# Patient Record
Sex: Male | Born: 1985 | Race: White | Hispanic: No | Marital: Married | Smoking: Never smoker
Health system: Southern US, Community
[De-identification: ages and names within clinical notes are randomized; demographics above are authoritative.]

## PROBLEM LIST (undated history)

## (undated) HISTORY — PX: VASECTOMY: SHX75

---
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
## 2021-08-02 ENCOUNTER — Encounter (HOSPITAL_BASED_OUTPATIENT_CLINIC_OR_DEPARTMENT_OTHER): Payer: Self-pay

## 2021-08-02 ENCOUNTER — Other Ambulatory Visit: Payer: Self-pay

## 2021-08-02 ENCOUNTER — Emergency Department (HOSPITAL_BASED_OUTPATIENT_CLINIC_OR_DEPARTMENT_OTHER)
Admission: EM | Admit: 2021-08-02 | Discharge: 2021-08-02 | Disposition: A | Discharge status: Home / Self Care | Payer: BC Managed Care – PPO | Attending: Emergency Medicine | Admitting: Emergency Medicine

## 2021-08-02 DIAGNOSIS — T18128A Food in esophagus causing other injury, initial encounter: Secondary | ICD-10-CM | POA: Diagnosis not present

## 2021-08-02 DIAGNOSIS — X58XXXA Exposure to other specified factors, initial encounter: Secondary | ICD-10-CM | POA: Insufficient documentation

## 2021-08-02 DIAGNOSIS — R1110 Vomiting, unspecified: Secondary | ICD-10-CM | POA: Insufficient documentation

## 2021-08-02 DIAGNOSIS — Z20822 Contact with and (suspected) exposure to covid-19: Secondary | ICD-10-CM | POA: Insufficient documentation

## 2021-08-02 LAB — RESP PANEL BY RT-PCR (FLU A&B, COVID) ARPGX2
Influenza A by PCR: NEGATIVE
Influenza B by PCR: NEGATIVE
SARS Coronavirus 2 by RT PCR: NEGATIVE

## 2021-08-02 NOTE — ED Triage Notes (Signed)
Patient here POV from Home with Foreign Body in Throat.  Patient states he was eating a Steak approximately at 1800 and since he's been having difficulty passing a piece of Steak.  Ambulatory, GCS 15. Patient in Discomfort during Triage.

## 2021-08-02 NOTE — ED Triage Notes (Signed)
Patient states he has a History of Same and is usually able to Vomit and gain Relief. Patient vomited several times today with no relief.

## 2021-08-02 NOTE — Discharge Instructions (Addendum)
Please eat liquids only as Discussed Call Dr. Haywood Pao office in the morning for appointment on Monday Return to [facility] emergency department if you are unable to tolerate fluids.

## 2021-08-03 NOTE — ED Provider Notes (Signed)
[facility] EMERGENCY DEPT Provider Note   CSN: [id number] Arrival date & time: 08/02/21  2126     History Chief Complaint  Patient presents with   Foreign Body In Throat    Alejandro Rhodes is a 35 y.o. male.  HPI  35 year old male presents tonight with sensation of foreign body in esophagus.  He states he has had multiple episodes in the past where he has felt that he has had a food impaction.  Tonight it occurred while eating steak.  He felt nothing else would pass.  He had 3 episodes of vomiting with the last occurring after arrival in the ED.  He states that he is now able to tolerate his saliva.  This has occurred 5 times in the past but he was always able to vomit and clear it.  He was never actually seen by a gastroenterologist.  He has recently moved to this area.  He denies any other medical problems or history.  He has had some pain with this but is not having any current pain.  History reviewed. No pertinent past medical history.  There are no problems to display for this patient.   History reviewed. No pertinent surgical history.     No family history on file.  Social History   Tobacco Use   Smoking status: Never   Smokeless tobacco: Never  Substance Use Topics   Alcohol use: Never   Drug use: Never    Home Medications Prior to Admission medications   Not on File    Allergies    Patient has no allergy information on record.  Review of Systems   Review of Systems  All other systems reviewed and are negative.  Physical Exam Updated Vital Signs BP 121/79 (BP Location: Right Arm)   Pulse 71   Temp 98.1 F (36.7 C) (Oral)   Resp 20   Ht 1.854 m (6\' 1" )   Wt 95.3 kg   SpO2 100%   BMI 27.71 kg/m   Physical Exam Vitals and nursing note reviewed.  Constitutional:      Appearance: He is well-developed.  HENT:     Head: Normocephalic and atraumatic.     Right Ear: External ear normal.     Left Ear: External ear normal.     Nose:  Nose normal.  Eyes:     Extraocular Movements: Extraocular movements intact.  Neck:     Trachea: No tracheal deviation.  Cardiovascular:     Rate and Rhythm: Normal rate.  Pulmonary:     Effort: Pulmonary effort is normal.  Abdominal:     General: Abdomen is flat. Bowel sounds are normal.     Palpations: Abdomen is soft.  Musculoskeletal:        General: Normal range of motion.     Cervical back: Normal range of motion.  Skin:    General: Skin is warm and dry.     Capillary Refill: Capillary refill takes less than 2 seconds.  Neurological:     Mental Status: He is alert and oriented to person, place, and time.  Psychiatric:        Mood and Affect: Mood normal.        Behavior: Behavior normal.    ED Results / Procedures / Treatments   Labs (all labs ordered are listed, but only abnormal results are displayed) Labs Reviewed  RESP PANEL BY RT-PCR (FLU A&B, COVID) [id number]    EKG None  Radiology No results found.  Procedures Procedures  Medications Ordered in ED Medications - No data to display  ED Course  I have reviewed the triage vital signs and the nursing notes.  Pertinent labs & imaging results that were available during my care of the patient were reviewed by me and considered in my medical decision making (see chart for details).    MDM Rules/Calculators/A&P                           Patient care discussed with Dr. Elnoria Howard.  In discussion with patient and his wife, decision made that patient will call office tomorrow for follow-up on Monday.  In the interim, he will take in only thickened liquids.  We have discussed return precautions and need for follow-up and he voices understanding. Final Clinical Impression(s) / ED Diagnoses Final diagnoses:  Food impaction of esophagus, initial encounter    Rx / DC Orders ED Discharge Orders     None        Margarita Grizzle, MD 08/03/21 1459

## 2021-08-06 DIAGNOSIS — R1110 Vomiting, unspecified: Secondary | ICD-10-CM | POA: Diagnosis not present

## 2021-08-06 DIAGNOSIS — R1310 Dysphagia, unspecified: Secondary | ICD-10-CM | POA: Diagnosis not present

## 2021-08-06 DIAGNOSIS — T18128A Food in esophagus causing other injury, initial encounter: Secondary | ICD-10-CM | POA: Diagnosis not present

## 2021-08-07 DIAGNOSIS — K200 Eosinophilic esophagitis: Secondary | ICD-10-CM | POA: Diagnosis not present

## 2021-08-07 DIAGNOSIS — K222 Esophageal obstruction: Secondary | ICD-10-CM | POA: Diagnosis not present

## 2021-08-07 DIAGNOSIS — R1310 Dysphagia, unspecified: Secondary | ICD-10-CM | POA: Diagnosis not present

## 2021-08-07 DIAGNOSIS — K2100 Gastro-esophageal reflux disease with esophagitis, without bleeding: Secondary | ICD-10-CM | POA: Diagnosis not present

## 2021-09-12 DIAGNOSIS — K200 Eosinophilic esophagitis: Secondary | ICD-10-CM | POA: Diagnosis not present

## 2021-10-02 DIAGNOSIS — Z800 Family history of malignant neoplasm of digestive organs: Secondary | ICD-10-CM | POA: Diagnosis not present

## 2021-10-02 DIAGNOSIS — E559 Vitamin D deficiency, unspecified: Secondary | ICD-10-CM | POA: Diagnosis not present

## 2021-10-02 DIAGNOSIS — Z0001 Encounter for general adult medical examination with abnormal findings: Secondary | ICD-10-CM | POA: Diagnosis not present

## 2021-10-02 DIAGNOSIS — Z13220 Encounter for screening for lipoid disorders: Secondary | ICD-10-CM | POA: Diagnosis not present

## 2021-10-02 DIAGNOSIS — Z0000 Encounter for general adult medical examination without abnormal findings: Secondary | ICD-10-CM | POA: Diagnosis not present

## 2021-10-02 DIAGNOSIS — B079 Viral wart, unspecified: Secondary | ICD-10-CM | POA: Diagnosis not present

## 2021-11-01 DIAGNOSIS — H1013 Acute atopic conjunctivitis, bilateral: Secondary | ICD-10-CM | POA: Diagnosis not present

## 2021-11-01 DIAGNOSIS — J3089 Other allergic rhinitis: Secondary | ICD-10-CM | POA: Diagnosis not present

## 2021-11-01 DIAGNOSIS — K9049 Malabsorption due to intolerance, not elsewhere classified: Secondary | ICD-10-CM | POA: Diagnosis not present

## 2021-11-01 DIAGNOSIS — Z23 Encounter for immunization: Secondary | ICD-10-CM | POA: Diagnosis not present

## 2021-11-01 DIAGNOSIS — K200 Eosinophilic esophagitis: Secondary | ICD-10-CM | POA: Diagnosis not present

## 2021-11-12 DIAGNOSIS — Z3009 Encounter for other general counseling and advice on contraception: Secondary | ICD-10-CM | POA: Diagnosis not present

## 2022-02-20 DIAGNOSIS — Z302 Encounter for sterilization: Secondary | ICD-10-CM | POA: Diagnosis not present

## 2022-05-24 ENCOUNTER — Encounter (HOSPITAL_BASED_OUTPATIENT_CLINIC_OR_DEPARTMENT_OTHER): Payer: Self-pay

## 2022-05-24 ENCOUNTER — Emergency Department (HOSPITAL_BASED_OUTPATIENT_CLINIC_OR_DEPARTMENT_OTHER): Payer: BC Managed Care – PPO | Admitting: Radiology

## 2022-05-24 ENCOUNTER — Other Ambulatory Visit: Payer: Self-pay

## 2022-05-24 ENCOUNTER — Other Ambulatory Visit (HOSPITAL_BASED_OUTPATIENT_CLINIC_OR_DEPARTMENT_OTHER): Payer: Self-pay

## 2022-05-24 ENCOUNTER — Emergency Department (HOSPITAL_BASED_OUTPATIENT_CLINIC_OR_DEPARTMENT_OTHER)
Admission: EM | Admit: 2022-05-24 | Discharge: 2022-05-24 | Disposition: A | Discharge status: Home / Self Care | Payer: BC Managed Care – PPO | Attending: Emergency Medicine | Admitting: Emergency Medicine

## 2022-05-24 DIAGNOSIS — M25551 Pain in right hip: Secondary | ICD-10-CM | POA: Diagnosis not present

## 2022-05-24 DIAGNOSIS — S4991XA Unspecified injury of right shoulder and upper arm, initial encounter: Secondary | ICD-10-CM | POA: Diagnosis not present

## 2022-05-24 DIAGNOSIS — S40211A Abrasion of right shoulder, initial encounter: Secondary | ICD-10-CM | POA: Insufficient documentation

## 2022-05-24 DIAGNOSIS — Y9355 Activity, bike riding: Secondary | ICD-10-CM | POA: Diagnosis not present

## 2022-05-24 DIAGNOSIS — W052XXA Fall from non-moving motorized mobility scooter, initial encounter: Secondary | ICD-10-CM | POA: Diagnosis not present

## 2022-05-24 DIAGNOSIS — Z043 Encounter for examination and observation following other accident: Secondary | ICD-10-CM | POA: Diagnosis not present

## 2022-05-24 DIAGNOSIS — S4351XA Sprain of right acromioclavicular joint, initial encounter: Secondary | ICD-10-CM | POA: Diagnosis not present

## 2022-05-24 MED ORDER — OXYCODONE-ACETAMINOPHEN 5-325 MG PO TABS
1.0000 | ORAL_TABLET | Freq: Four times a day (QID) | ORAL | 0 refills | Status: DC | PRN
  Filled 2022-05-24: qty 12, 3d supply, fill #0

## 2022-05-24 MED ORDER — OXYCODONE-ACETAMINOPHEN 5-325 MG PO TABS
1.0000 | ORAL_TABLET | Freq: Once | ORAL | Status: AC
  Administered 2022-05-24: 1 via ORAL
  Filled 2022-05-24: qty 1

## 2022-05-24 MED ORDER — IBUPROFEN 800 MG PO TABS
800.0000 mg | ORAL_TABLET | Freq: Once | ORAL | Status: AC
  Administered 2022-05-24: 800 mg via ORAL
  Filled 2022-05-24: qty 1

## 2022-05-24 NOTE — ED Notes (Signed)
Pt d/c home with mom per MD order. Discharge summary reviewed , verbalize understanding. Off unit via WC. No s/s of acute distress noted. Reports mother is discharge ride home.

## 2022-05-24 NOTE — ED Provider Notes (Signed)
My attending, assigned to this chart, was unable to access his [employer] software for prescribing Schedule II prescriptions. I checked his PDMP and prescribed Percoc[facility] qty:[facility].    Achille Rich, PA-C 05/24/22 1256    Milagros Loll, MD 05/25/22 [phone]
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
## 2022-05-24 NOTE — Discharge Instructions (Addendum)
Please follow-up with orthopedic surgeon regarding your [facility] joint injury. The radiologist said it is: "At least grade 3 right [facility] joint separation without acute fracture."  I would recommend using the sling and no weightbearing for now unless you are further instructed by Ortho.  If at any point you develop generalized abdominal pain, vomiting, fever, blood in urine, difficulty breathing, confusion, episodes of passing out or other new concerning symptom, come back to ER for reassessment.

## 2022-05-24 NOTE — ED Notes (Signed)
Patient transported to X-ray 

## 2022-05-28 ENCOUNTER — Other Ambulatory Visit (HOSPITAL_COMMUNITY): Payer: Self-pay

## 2022-05-28 DIAGNOSIS — M25511 Pain in right shoulder: Secondary | ICD-10-CM | POA: Diagnosis not present

## 2022-05-28 MED ORDER — GABAPENTIN 100 MG PO CAPS
100.0000 mg | ORAL_CAPSULE | Freq: Three times a day (TID) | ORAL | 1 refills | Status: DC
  Filled 2022-05-28: qty 90, 30d supply, fill #0

## 2022-05-28 MED ORDER — ACETAMINOPHEN EXTRA STRENGTH 500 MG PO CAPS
1000.0000 mg | ORAL_CAPSULE | Freq: Three times a day (TID) | ORAL | 1 refills | Status: DC
Start: 2022-05-28 — End: 2023-07-15

## 2022-05-28 MED ORDER — MELOXICAM 7.5 MG PO TABS
7.5000 mg | ORAL_TABLET | Freq: Two times a day (BID) | ORAL | 2 refills | Status: DC
  Filled 2022-05-28: qty 60, 30d supply, fill #0

## 2022-06-03 ENCOUNTER — Other Ambulatory Visit (HOSPITAL_COMMUNITY): Payer: Self-pay

## 2022-06-03 DIAGNOSIS — S43101A Unspecified dislocation of right acromioclavicular joint, initial encounter: Secondary | ICD-10-CM | POA: Diagnosis not present

## 2022-06-03 DIAGNOSIS — M24111 Other articular cartilage disorders, right shoulder: Secondary | ICD-10-CM | POA: Diagnosis not present

## 2022-06-03 DIAGNOSIS — Y999 Unspecified external cause status: Secondary | ICD-10-CM | POA: Diagnosis not present

## 2022-06-03 DIAGNOSIS — S43131A Dislocation of right acromioclavicular joint, greater than 200% displacement, initial encounter: Secondary | ICD-10-CM | POA: Diagnosis not present

## 2022-06-03 DIAGNOSIS — X58XXXA Exposure to other specified factors, initial encounter: Secondary | ICD-10-CM | POA: Diagnosis not present

## 2022-06-03 MED ORDER — OXYCODONE HCL 5 MG PO TABS
5.0000 mg | ORAL_TABLET | Freq: Four times a day (QID) | ORAL | 0 refills | Status: DC
  Filled 2022-06-03: qty 30, 5d supply, fill #0

## 2022-06-03 MED ORDER — ONDANSETRON HCL 4 MG PO TABS
4.0000 mg | ORAL_TABLET | Freq: Four times a day (QID) | ORAL | 0 refills | Status: DC | PRN
  Filled 2022-06-03: qty 10, 7d supply, fill #0

## 2022-06-11 DIAGNOSIS — M24111 Other articular cartilage disorders, right shoulder: Secondary | ICD-10-CM | POA: Diagnosis not present

## 2022-06-26 DIAGNOSIS — M25611 Stiffness of right shoulder, not elsewhere classified: Secondary | ICD-10-CM | POA: Diagnosis not present

## 2022-06-26 DIAGNOSIS — S4351XD Sprain of right acromioclavicular joint, subsequent encounter: Secondary | ICD-10-CM | POA: Diagnosis not present

## 2022-06-26 DIAGNOSIS — M6281 Muscle weakness (generalized): Secondary | ICD-10-CM | POA: Diagnosis not present

## 2022-07-03 DIAGNOSIS — M25611 Stiffness of right shoulder, not elsewhere classified: Secondary | ICD-10-CM | POA: Diagnosis not present

## 2022-07-03 DIAGNOSIS — M6281 Muscle weakness (generalized): Secondary | ICD-10-CM | POA: Diagnosis not present

## 2022-07-10 DIAGNOSIS — M25611 Stiffness of right shoulder, not elsewhere classified: Secondary | ICD-10-CM | POA: Diagnosis not present

## 2022-07-10 DIAGNOSIS — M6281 Muscle weakness (generalized): Secondary | ICD-10-CM | POA: Diagnosis not present

## 2022-07-17 DIAGNOSIS — M25611 Stiffness of right shoulder, not elsewhere classified: Secondary | ICD-10-CM | POA: Diagnosis not present

## 2022-07-17 DIAGNOSIS — M6281 Muscle weakness (generalized): Secondary | ICD-10-CM | POA: Diagnosis not present

## 2022-07-24 DIAGNOSIS — M6281 Muscle weakness (generalized): Secondary | ICD-10-CM | POA: Diagnosis not present

## 2022-07-24 DIAGNOSIS — M25611 Stiffness of right shoulder, not elsewhere classified: Secondary | ICD-10-CM | POA: Diagnosis not present

## 2022-07-31 DIAGNOSIS — M25611 Stiffness of right shoulder, not elsewhere classified: Secondary | ICD-10-CM | POA: Diagnosis not present

## 2022-07-31 DIAGNOSIS — M6281 Muscle weakness (generalized): Secondary | ICD-10-CM | POA: Diagnosis not present

## 2022-08-07 DIAGNOSIS — M25611 Stiffness of right shoulder, not elsewhere classified: Secondary | ICD-10-CM | POA: Diagnosis not present

## 2022-08-07 DIAGNOSIS — M6281 Muscle weakness (generalized): Secondary | ICD-10-CM | POA: Diagnosis not present

## 2022-08-14 DIAGNOSIS — M25611 Stiffness of right shoulder, not elsewhere classified: Secondary | ICD-10-CM | POA: Diagnosis not present

## 2022-08-14 DIAGNOSIS — M6281 Muscle weakness (generalized): Secondary | ICD-10-CM | POA: Diagnosis not present

## 2022-08-20 DIAGNOSIS — M6281 Muscle weakness (generalized): Secondary | ICD-10-CM | POA: Diagnosis not present

## 2022-08-20 DIAGNOSIS — M25611 Stiffness of right shoulder, not elsewhere classified: Secondary | ICD-10-CM | POA: Diagnosis not present

## 2022-08-28 DIAGNOSIS — M25611 Stiffness of right shoulder, not elsewhere classified: Secondary | ICD-10-CM | POA: Diagnosis not present

## 2022-08-28 DIAGNOSIS — M6281 Muscle weakness (generalized): Secondary | ICD-10-CM | POA: Diagnosis not present

## 2022-08-29 DIAGNOSIS — S4351XD Sprain of right acromioclavicular joint, subsequent encounter: Secondary | ICD-10-CM | POA: Diagnosis not present

## 2022-09-05 DIAGNOSIS — M25611 Stiffness of right shoulder, not elsewhere classified: Secondary | ICD-10-CM | POA: Diagnosis not present

## 2022-09-05 DIAGNOSIS — M6281 Muscle weakness (generalized): Secondary | ICD-10-CM | POA: Diagnosis not present

## 2022-09-25 DIAGNOSIS — M6281 Muscle weakness (generalized): Secondary | ICD-10-CM | POA: Diagnosis not present

## 2022-09-25 DIAGNOSIS — M25611 Stiffness of right shoulder, not elsewhere classified: Secondary | ICD-10-CM | POA: Diagnosis not present

## 2022-10-07 DIAGNOSIS — Z0000 Encounter for general adult medical examination without abnormal findings: Secondary | ICD-10-CM | POA: Diagnosis not present

## 2022-10-08 DIAGNOSIS — M25611 Stiffness of right shoulder, not elsewhere classified: Secondary | ICD-10-CM | POA: Diagnosis not present

## 2022-10-08 DIAGNOSIS — M6281 Muscle weakness (generalized): Secondary | ICD-10-CM | POA: Diagnosis not present

## 2022-11-26 ENCOUNTER — Other Ambulatory Visit (HOSPITAL_COMMUNITY): Payer: Self-pay

## 2023-05-13 DIAGNOSIS — M7581 Other shoulder lesions, right shoulder: Secondary | ICD-10-CM | POA: Diagnosis not present

## 2023-05-19 DIAGNOSIS — S46111D Strain of muscle, fascia and tendon of long head of biceps, right arm, subsequent encounter: Secondary | ICD-10-CM | POA: Diagnosis not present

## 2023-05-19 DIAGNOSIS — S43431D Superior glenoid labrum lesion of right shoulder, subsequent encounter: Secondary | ICD-10-CM | POA: Diagnosis not present

## 2023-05-19 DIAGNOSIS — M6281 Muscle weakness (generalized): Secondary | ICD-10-CM | POA: Diagnosis not present

## 2023-05-19 DIAGNOSIS — S46011D Strain of muscle(s) and tendon(s) of the rotator cuff of right shoulder, subsequent encounter: Secondary | ICD-10-CM | POA: Diagnosis not present

## 2023-05-27 DIAGNOSIS — S46011D Strain of muscle(s) and tendon(s) of the rotator cuff of right shoulder, subsequent encounter: Secondary | ICD-10-CM | POA: Diagnosis not present

## 2023-05-27 DIAGNOSIS — S46111D Strain of muscle, fascia and tendon of long head of biceps, right arm, subsequent encounter: Secondary | ICD-10-CM | POA: Diagnosis not present

## 2023-05-27 DIAGNOSIS — M6281 Muscle weakness (generalized): Secondary | ICD-10-CM | POA: Diagnosis not present

## 2023-05-27 DIAGNOSIS — S43431D Superior glenoid labrum lesion of right shoulder, subsequent encounter: Secondary | ICD-10-CM | POA: Diagnosis not present

## 2023-06-03 DIAGNOSIS — M6281 Muscle weakness (generalized): Secondary | ICD-10-CM | POA: Diagnosis not present

## 2023-06-03 DIAGNOSIS — S46111D Strain of muscle, fascia and tendon of long head of biceps, right arm, subsequent encounter: Secondary | ICD-10-CM | POA: Diagnosis not present

## 2023-06-03 DIAGNOSIS — S46011D Strain of muscle(s) and tendon(s) of the rotator cuff of right shoulder, subsequent encounter: Secondary | ICD-10-CM | POA: Diagnosis not present

## 2023-06-03 DIAGNOSIS — S43431D Superior glenoid labrum lesion of right shoulder, subsequent encounter: Secondary | ICD-10-CM | POA: Diagnosis not present

## 2023-06-10 DIAGNOSIS — S46011D Strain of muscle(s) and tendon(s) of the rotator cuff of right shoulder, subsequent encounter: Secondary | ICD-10-CM | POA: Diagnosis not present

## 2023-06-10 DIAGNOSIS — S46111D Strain of muscle, fascia and tendon of long head of biceps, right arm, subsequent encounter: Secondary | ICD-10-CM | POA: Diagnosis not present

## 2023-06-10 DIAGNOSIS — M6281 Muscle weakness (generalized): Secondary | ICD-10-CM | POA: Diagnosis not present

## 2023-06-10 DIAGNOSIS — S43431D Superior glenoid labrum lesion of right shoulder, subsequent encounter: Secondary | ICD-10-CM | POA: Diagnosis not present

## 2023-06-24 DIAGNOSIS — M25511 Pain in right shoulder: Secondary | ICD-10-CM | POA: Diagnosis not present

## 2023-07-15 ENCOUNTER — Encounter: Payer: Self-pay | Admitting: Family Medicine

## 2023-07-15 ENCOUNTER — Ambulatory Visit: Payer: BC Managed Care – PPO | Admitting: Family Medicine

## 2023-07-15 VITALS — BP 131/102 | HR 73 | Resp 18 | Ht 73.0 in | Wt 209.8 lb

## 2023-07-15 DIAGNOSIS — Z8719 Personal history of other diseases of the digestive system: Secondary | ICD-10-CM

## 2023-07-15 DIAGNOSIS — R030 Elevated blood-pressure reading, without diagnosis of hypertension: Secondary | ICD-10-CM | POA: Insufficient documentation

## 2023-07-15 DIAGNOSIS — Z0000 Encounter for general adult medical examination without abnormal findings: Secondary | ICD-10-CM

## 2023-07-15 DIAGNOSIS — L989 Disorder of the skin and subcutaneous tissue, unspecified: Secondary | ICD-10-CM | POA: Diagnosis not present

## 2023-07-15 DIAGNOSIS — E7849 Other hyperlipidemia: Secondary | ICD-10-CM | POA: Diagnosis not present

## 2023-07-15 NOTE — Patient Instructions (Addendum)
Great meeting you today!   I will reach out about your blood work results   Take omeprazole daily before eating

## 2023-07-15 NOTE — Assessment & Plan Note (Signed)
8 patient has a skin lesion in his supra clavicular notch.  It is slightly discolored.  It is pustular in nature.  I will refer to dermatology.

## 2023-07-15 NOTE — Progress Notes (Signed)
New  patient visit   Patient: Alejandro Rhodes   DOB: 1986/06/23   37 y.o. Male  MRN: [id number] Visit Date: 07/15/2023  Today's healthcare provider: Charlton Amor, DO   Chief Complaint  Patient presents with   New Patient (Initial Visit)    Pt is coming in to establish care    SUBJECTIVE    Chief Complaint  Patient presents with   New Patient (Initial Visit)    Pt is coming in to establish care   HPI Patient presents for new patient follow-up.  He is here today with no concerns.  Diet: Adheres to healthy diet.  He is a Administrator, [employer] and does well with eating grilled chicken's or salads. Exercise: Patient exercises regularly by riding the Peloton or swimming 5 to 6 days a week for about 45 minutes to an hour.  Family hx: HTN: Dad DM: None Cancer: None  Social hx: Alcohol use: None Tobacco (chew, smoke): None  Who do you live with: Wife and 4 daughters.   Review of Systems  Constitutional:  Negative for activity change, fatigue and fever.  Respiratory:  Negative for cough and shortness of breath.   Cardiovascular:  Negative for chest pain.  Gastrointestinal:  Negative for abdominal pain.  Genitourinary:  Negative for difficulty urinating.       No outpatient medications have been marked as taking for the 07/15/23 encounter (Office Visit) with Charlton Amor, DO.    OBJECTIVE    BP (!) 131/102 (BP Location: Left Arm, Patient Position: Sitting, Cuff Size: Normal)   Pulse 73   Resp 18   Ht 6\' 1"  (1.854 m)   Wt 209 lb 12 oz (95.1 kg)   SpO2 99%   BMI 27.67 kg/m   Physical Exam Vitals and nursing note reviewed.  Constitutional:      General: He is not in acute distress.    Appearance: Normal appearance.  HENT:     Head: Normocephalic and atraumatic.     Right Ear: External ear normal.     Left Ear: External ear normal.     Nose: Nose normal.  Eyes:     Conjunctiva/sclera: Conjunctivae normal.  Cardiovascular:     Rate and Rhythm:  Normal rate and regular rhythm.  Pulmonary:     Effort: Pulmonary effort is normal.     Breath sounds: Normal breath sounds.  Abdominal:     General: Abdomen is flat. Bowel sounds are normal.     Palpations: Abdomen is soft.     Tenderness: There is no abdominal tenderness.  Musculoskeletal:        General: No swelling. Normal range of motion.  Skin:    Comments: Patient has papule in his subclavicular notch.  Some discoloration is present.  Neurological:     General: No focal deficit present.     Mental Status: He is alert and oriented to person, place, and time.  Psychiatric:        Mood and Affect: Mood normal.        Behavior: Behavior normal.        Thought Content: Thought content normal.        Judgment: Judgment normal.        ASSESSMENT & PLAN    Problem List Items Addressed This Visit       Musculoskeletal and Integument   Skin lesion    8 patient has a skin lesion in his supra clavicular notch.  It  is slightly discolored.  It is pustular in nature.  I will refer to dermatology.      Relevant Orders   Ambulatory referral to Dermatology     Other   Routine adult health maintenance - Primary    Patient is a 37 year old male who presents today for new patient visit.  We will go ahead and get blood work.      Relevant Orders   CBC   BASIC METABOLIC PANEL WITH GFR   Lipid panel   History of esophageal stricture    Patient has a history of esophageal stricture.  I believe he needs to go back to GI since he is having some symptoms of food getting stuck in his throat again.  He has not seen them in about 2 years.  He may need dilation again.      Relevant Orders   Ambulatory referral to Gastroenterology   Elevated blood pressure reading    - bp elevated today in clinic, will repeat prior to leaving  - pt did have espresso iced coffee this morning       Return in about 1 year (around 07/14/2024) for wellness.      No orders of the defined types were  placed in this encounter.   Orders Placed This Encounter  Procedures   CBC   BASIC METABOLIC PANEL WITH GFR   Lipid panel    Order Specific Question:   Has the patient fasted?    Answer:   No    Order Specific Question:   Release to patient    Answer:   Immediate   Ambulatory referral to Dermatology    Referral Priority:   Routine    Referral Type:   Consultation    Referral Reason:   Specialty Services Required    Requested Specialty:   Dermatology    Number of Visits Requested:   1   Ambulatory referral to Gastroenterology    Referral Priority:   Routine    Referral Type:   Consultation    Referral Reason:   Specialty Services Required    Number of Visits Requested:   1     Charlton Amor, DO  [facility] Primary Care & Sports Medicine at [facility] [phone] (phone) [phone] (fax)  [facility]

## 2023-07-15 NOTE — Assessment & Plan Note (Signed)
Patient is a 37 year old male who presents today for new patient visit.  We will go ahead and get blood work.

## 2023-07-15 NOTE — Assessment & Plan Note (Signed)
-   bp elevated today in clinic, will repeat prior to leaving  - pt did have espresso iced coffee this morning

## 2023-07-15 NOTE — Assessment & Plan Note (Signed)
Patient has a history of esophageal stricture.  I believe he needs to go back to GI since he is having some symptoms of food getting stuck in his throat again.  He has not seen them in about 2 years.  He may need dilation again.

## 2023-07-16 LAB — CBC
HCT: 53.3 % — ABNORMAL HIGH (ref 38.5–50.0)
Hemoglobin: 17.8 g/dL — ABNORMAL HIGH (ref 13.2–17.1)
MCH: 30.1 pg (ref 27.0–33.0)
MCHC: 33.4 g/dL (ref 32.0–36.0)
MCV: 90 fL (ref 80.0–100.0)
MPV: 10.5 fL (ref 7.5–12.5)
Platelets: 241 10*3/uL (ref 140–400)
RBC: 5.92 10*6/uL — ABNORMAL HIGH (ref 4.20–5.80)
RDW: 12.7 % (ref 11.0–15.0)
WBC: 4.9 10*3/uL (ref 3.8–10.8)

## 2023-07-16 LAB — BASIC METABOLIC PANEL WITH GFR
BUN: 14 mg/dL (ref 7–25)
CO2: 31 mmol/L (ref 20–32)
Calcium: 10.4 mg/dL — ABNORMAL HIGH (ref 8.6–10.3)
Chloride: 102 mmol/L (ref 98–110)
Creat: 1.12 mg/dL (ref 0.60–1.26)
Glucose, Bld: 79 mg/dL (ref 65–99)
Potassium: 4.7 mmol/L (ref 3.5–5.3)
Sodium: 141 mmol/L (ref 135–146)
eGFR: 88 mL/min/{1.73_m2} (ref >=60)

## 2023-07-16 LAB — LIPID PANEL
Cholesterol: 206 mg/dL — ABNORMAL HIGH (ref <200)
HDL: 65 mg/dL (ref >=40)
LDL Cholesterol (Calc): 125 mg/dL (calc) — ABNORMAL HIGH
Non-HDL Cholesterol (Calc): 141 mg/dL (calc) — ABNORMAL HIGH (ref <130)
Total CHOL/HDL Ratio: 3.2 (calc) (ref <5.0)
Triglycerides: 72 mg/dL (ref <150)

## 2023-07-22 ENCOUNTER — Encounter: Payer: Self-pay | Admitting: Family Medicine

## 2023-07-28 DIAGNOSIS — K222 Esophageal obstruction: Secondary | ICD-10-CM | POA: Diagnosis not present

## 2023-07-28 DIAGNOSIS — R490 Dysphonia: Secondary | ICD-10-CM | POA: Diagnosis not present

## 2023-07-28 DIAGNOSIS — K219 Gastro-esophageal reflux disease without esophagitis: Secondary | ICD-10-CM | POA: Diagnosis not present

## 2023-09-03 DIAGNOSIS — L910 Hypertrophic scar: Secondary | ICD-10-CM | POA: Diagnosis not present

## 2023-09-03 DIAGNOSIS — K219 Gastro-esophageal reflux disease without esophagitis: Secondary | ICD-10-CM | POA: Diagnosis not present

## 2023-09-03 DIAGNOSIS — K200 Eosinophilic esophagitis: Secondary | ICD-10-CM | POA: Diagnosis not present
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
## 2023-10-20 ENCOUNTER — Encounter: Payer: Self-pay | Admitting: Family Medicine

## 2023-11-05 ENCOUNTER — Ambulatory Visit: Payer: BC Managed Care – PPO | Admitting: Dermatology

## 2024-03-17 ENCOUNTER — Encounter: Payer: Self-pay | Admitting: Family Medicine

## 2024-03-22 ENCOUNTER — Ambulatory Visit

## 2024-03-22 ENCOUNTER — Ambulatory Visit: Admitting: Sports Medicine

## 2024-03-22 ENCOUNTER — Encounter: Payer: Self-pay | Admitting: Sports Medicine

## 2024-03-22 ENCOUNTER — Other Ambulatory Visit (HOSPITAL_BASED_OUTPATIENT_CLINIC_OR_DEPARTMENT_OTHER): Payer: Self-pay

## 2024-03-22 DIAGNOSIS — M7731 Calcaneal spur, right foot: Secondary | ICD-10-CM | POA: Diagnosis not present

## 2024-03-22 DIAGNOSIS — G8929 Other chronic pain: Secondary | ICD-10-CM | POA: Diagnosis not present

## 2024-03-22 DIAGNOSIS — M722 Plantar fascial fibromatosis: Secondary | ICD-10-CM

## 2024-03-22 DIAGNOSIS — M79671 Pain in right foot: Secondary | ICD-10-CM | POA: Diagnosis not present

## 2024-03-22 MED ORDER — MELOXICAM 15 MG PO TABS
15.0000 mg | ORAL_TABLET | Freq: Every day | ORAL | 3 refills | Status: AC
  Filled 2024-03-22: qty 30, 30d supply, fill #0

## 2024-03-22 NOTE — Assessment & Plan Note (Signed)
 This is a very pleasant 38 year old male, he has had a long history of on and off pain right foot plantar aspect. He tried some over-the-counter remedies including orthotics, stretching. Unfortunately continues to have discomfort, he localizes this on the plantar aspect of the right foot worse with the first few steps. This is decreased his ability to play basketball with his daughters. On exam he does have minimal pes cavus, tenderness at the plantar fascial origin. I have suggested multiple modalities including nocturnal splinting, air heel bracing, foot intrinsic muscle strengthening, meloxicam, continued use of his orthotics, avoidance of barefoot walking. We will try this for 6 weeks before considering an injection.

## 2024-03-22 NOTE — Progress Notes (Signed)
    Procedures performed today:    None.  Independent interpretation of notes and tests performed by another provider:   None.  Brief History, Exam, Impression, and Recommendations:    Plantar fasciitis, right This is a very pleasant 38 year old male, he has had a long history of on and off pain right foot plantar aspect. He tried some over-the-counter remedies including orthotics, stretching. Unfortunately continues to have discomfort, he localizes this on the plantar aspect of the right foot worse with the first few steps. This is decreased his ability to play basketball with his daughters. On exam he does have minimal pes cavus, tenderness at the plantar fascial origin. I have suggested multiple modalities including nocturnal splinting, air heel bracing, foot intrinsic muscle strengthening, meloxicam, continued use of his orthotics, avoidance of barefoot walking. We will try this for 6 weeks before considering an injection.  I spent 30 minutes of total time managing this patient today, this includes chart review, face to face, and non-face to face time.  ____________________________________________ Ihor Austin. Benjamin Stain, M.D., ABFM., CAQSM., AME. Primary Care and Sports Medicine [facility]  Adjunct Professor of Family Medicine  [employer]  Restaurant manager, fast food

## 2024-03-25 ENCOUNTER — Other Ambulatory Visit (HOSPITAL_BASED_OUTPATIENT_CLINIC_OR_DEPARTMENT_OTHER): Payer: Self-pay

## 2024-05-03 ENCOUNTER — Ambulatory Visit: Admitting: Sports Medicine

## 2024-05-03 DIAGNOSIS — M722 Plantar fascial fibromatosis: Secondary | ICD-10-CM

## 2024-05-03 NOTE — Progress Notes (Signed)
    Procedures performed today:    None.  Independent interpretation of notes and tests performed by another provider:   None.  Brief History, Exam, Impression, and Recommendations:    Plantar fasciitis, right This is a very pleasant 38 year old male, he initially saw me about 6 weeks ago with a long history of on and off pain right foot plantar aspect, he tried some over-the-counter remedies including orthotics and stretching, continues to have discomfort. He has not been able to play basketball with his daughters. On exam he has some pes cavus, tenderness at the plantar fascial origin, we suggested nocturnal splinting, air heel bracing, foot intrinsic strengthening exercises, meloxicam  and avoidance of barefoot walking, continuance of orthotics. He has done the majority of the above, he could be a little more consistent with his strengthening exercises and air heel bracing, he is not doing much of the nocturnal splinting, however with using some of the remedies and not all he has noted dramatic improvements in his pain, he will continue all of the above and he can return to see me as needed. He does understand that should his discomfort become intolerable or worsen he will get completely compliant with the foot exercises, air heel bracing, and nocturnal splinting. No need for injection, return as needed.    ____________________________________________ Joselyn Nicely. Sandy Crumb, M.D., ABFM., CAQSM., AME. Primary Care and Sports Medicine [facility]  Adjunct Professor of [facility] Medicine  [employer]  Restaurant manager, fast food

## 2024-05-03 NOTE — Assessment & Plan Note (Signed)
 This is a very pleasant 38 year old male, he initially saw me about 6 weeks ago with a long history of on and off pain right foot plantar aspect, he tried some over-the-counter remedies including orthotics and stretching, continues to have discomfort. He has not been able to play basketball with his daughters. On exam he has some pes cavus, tenderness at the plantar fascial origin, we suggested nocturnal splinting, air heel bracing, foot intrinsic strengthening exercises, meloxicam  and avoidance of barefoot walking, continuance of orthotics. He has done the majority of the above, he could be a little more consistent with his strengthening exercises and air heel bracing, he is not doing much of the nocturnal splinting, however with using some of the remedies and not all he has noted dramatic improvements in his pain, he will continue all of the above and he can return to see me as needed. He does understand that should his discomfort become intolerable or worsen he will get completely compliant with the foot exercises, air heel bracing, and nocturnal splinting. No need for injection, return as needed.

## 2024-05-06 ENCOUNTER — Encounter: Payer: Self-pay | Admitting: Family Medicine

## 2024-05-11 ENCOUNTER — Encounter: Payer: Self-pay | Admitting: Sports Medicine

## 2024-06-14 DIAGNOSIS — K222 Esophageal obstruction: Secondary | ICD-10-CM | POA: Diagnosis not present

## 2024-06-14 DIAGNOSIS — K200 Eosinophilic esophagitis: Secondary | ICD-10-CM | POA: Diagnosis not present

## 2024-08-31 ENCOUNTER — Encounter: Payer: Self-pay | Admitting: Sports Medicine

## 2024-10-25 DIAGNOSIS — R509 Fever, unspecified: Secondary | ICD-10-CM | POA: Diagnosis not present

## 2024-10-25 DIAGNOSIS — U071 COVID-19: Secondary | ICD-10-CM | POA: Diagnosis not present
